# Patient Record
Sex: Male | Born: 2005
Health system: Southern US, Community
[De-identification: ages and names within clinical notes are randomized; demographics above are authoritative.]

## PROBLEM LIST (undated history)

## (undated) DIAGNOSIS — F88 Other disorders of psychological development: Secondary | ICD-10-CM

## (undated) DIAGNOSIS — F419 Anxiety disorder, unspecified: Secondary | ICD-10-CM

## (undated) DIAGNOSIS — R010 Benign and innocent cardiac murmurs: Secondary | ICD-10-CM

## (undated) DIAGNOSIS — F809 Developmental disorder of speech and language, unspecified: Secondary | ICD-10-CM

## (undated) DIAGNOSIS — F800 Phonological disorder: Secondary | ICD-10-CM

## (undated) DIAGNOSIS — R278 Other lack of coordination: Secondary | ICD-10-CM

## (undated) DIAGNOSIS — T7840XA Allergy, unspecified, initial encounter: Secondary | ICD-10-CM

## (undated) HISTORY — DX: Benign and innocent cardiac murmurs: R01.0

## (undated) HISTORY — DX: Developmental disorder of speech and language, unspecified: F80.9

## (undated) HISTORY — DX: Other lack of coordination: R27.8

## (undated) HISTORY — DX: Phonological disorder: F80.0

## (undated) HISTORY — PX: CIRCUMCISION: SUR203

## (undated) HISTORY — DX: Anxiety disorder, unspecified: F41.9

## (undated) HISTORY — DX: Allergy, unspecified, initial encounter: T78.40XA

## (undated) HISTORY — DX: Other disorders of psychological development: F88

---
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
## 2023-01-15 ENCOUNTER — Other Ambulatory Visit (HOSPITAL_COMMUNITY): Payer: Self-pay

## 2023-01-19 ENCOUNTER — Other Ambulatory Visit (HOSPITAL_COMMUNITY): Payer: Self-pay

## 2023-01-20 ENCOUNTER — Encounter: Payer: Self-pay | Admitting: Pharmacist

## 2023-01-20 ENCOUNTER — Other Ambulatory Visit: Payer: Self-pay

## 2023-01-20 ENCOUNTER — Other Ambulatory Visit (HOSPITAL_COMMUNITY): Payer: Self-pay

## 2023-01-26 ENCOUNTER — Other Ambulatory Visit: Payer: Self-pay

## 2023-01-28 ENCOUNTER — Other Ambulatory Visit (HOSPITAL_COMMUNITY): Payer: Self-pay

## 2023-02-02 ENCOUNTER — Encounter: Payer: Self-pay | Admitting: Pediatrics

## 2023-03-05 ENCOUNTER — Other Ambulatory Visit (HOSPITAL_COMMUNITY): Payer: Self-pay

## 2023-04-02 ENCOUNTER — Other Ambulatory Visit (HOSPITAL_COMMUNITY): Payer: Self-pay

## 2023-04-02 ENCOUNTER — Other Ambulatory Visit: Payer: Self-pay | Admitting: Pediatrics

## 2023-04-08 ENCOUNTER — Other Ambulatory Visit (HOSPITAL_COMMUNITY): Payer: Self-pay

## 2023-04-08 ENCOUNTER — Other Ambulatory Visit: Payer: Self-pay

## 2023-04-08 ENCOUNTER — Telehealth: Payer: Self-pay | Admitting: Pediatrics

## 2023-04-08 NOTE — Telephone Encounter (Signed)
Mother called and stated that Thomas Vazquez needs a refill on Azstarys sent to [facility].

## 2023-04-09 ENCOUNTER — Other Ambulatory Visit (HOSPITAL_COMMUNITY): Payer: Self-pay

## 2023-04-09 ENCOUNTER — Other Ambulatory Visit: Payer: Self-pay | Admitting: Pediatrics

## 2023-04-13 ENCOUNTER — Other Ambulatory Visit (HOSPITAL_COMMUNITY): Payer: Self-pay

## 2023-04-13 MED ORDER — AZSTARYS 26.1-5.2 MG PO CAPS
1.0000 | ORAL_CAPSULE | Freq: Every day | ORAL | 0 refills | Status: DC
  Filled 2023-04-13: qty 30, 30d supply, fill #0

## 2023-04-13 NOTE — Telephone Encounter (Signed)
Refilled Azstarys as Jerred is out of town in at school and could not come in for med check.  Has med check visit scheduled for next month.

## 2023-05-13 ENCOUNTER — Encounter: Payer: Self-pay | Admitting: Pediatrics

## 2023-05-13 ENCOUNTER — Ambulatory Visit (INDEPENDENT_AMBULATORY_CARE_PROVIDER_SITE_OTHER): Payer: Self-pay | Admitting: Pediatrics

## 2023-05-13 ENCOUNTER — Other Ambulatory Visit (HOSPITAL_COMMUNITY): Payer: Self-pay

## 2023-05-13 DIAGNOSIS — F900 Attention-deficit hyperactivity disorder, predominantly inattentive type: Secondary | ICD-10-CM

## 2023-05-13 MED ORDER — AZSTARYS 26.1-5.2 MG PO CAPS
1.0000 | ORAL_CAPSULE | Freq: Every day | ORAL | 0 refills | Status: DC
  Filled 2023-07-21: qty 30, 30d supply, fill #0

## 2023-05-13 MED ORDER — AZSTARYS 26.1-5.2 MG PO CAPS
1.0000 | ORAL_CAPSULE | Freq: Every day | ORAL | 0 refills | Status: AC
  Filled 2023-05-13: qty 30, 30d supply, fill #0

## 2023-05-13 MED ORDER — AZSTARYS 26.1-5.2 MG PO CAPS: 1.0000 | ORAL_CAPSULE | Freq: Every day | ORAL | 0 refills | Status: DC

## 2023-05-13 NOTE — Patient Instructions (Signed)

## 2023-05-13 NOTE — Progress Notes (Signed)
    Thomas Vazquez is a 17 y.o. old male here for ADHD medication management  BP 112/70   Ht 6\' 5"  (1.956 m)   Wt 162 lb 8 oz (73.7 kg)   BMI 19.27 kg/m  Blood pressure %iles are not available for patients who are 18 years or older.  --Normal growth parameters and Blood pressure.  --Parent reports child is doing well on present dose with no significant side effects reported.  --Plan to continue on current dose and will provide refill and 2 post dated prescriptions.  Plan to return in 3 months for ADHD med check or prior for any issues or concerns.   Meds ordered this encounter  Medications   Serdexmethylphen-Dexmethylphen (AZSTARYS) 26.1-5.2 MG CAPS    Sig: Take 1 capsule by mouth daily.    Dispense:  30 capsule    Refill:  0   Serdexmethylphen-Dexmethylphen (AZSTARYS) 26.1-5.2 MG CAPS    Sig: Take 1 capsule by mouth daily.    Dispense:  30 capsule    Refill:  0    Please do not fill till 06/12/2023   Serdexmethylphen-Dexmethylphen (AZSTARYS) 26.1-5.2 MG CAPS    Sig: Take 1 capsule by mouth daily.    Dispense:  30 capsule    Refill:  0    Please do not fill till 07/12/2023    Return in about 3 months (around 08/11/2023).  Thomas Vazquez D.O.

## 2023-05-17 ENCOUNTER — Other Ambulatory Visit (HOSPITAL_COMMUNITY): Payer: Self-pay

## 2023-05-23 NOTE — Progress Notes (Unsigned)
Follow Up Note  RE: Thomas Vazquez MRN: [id number] DOB: 09-07-2005 Date of Office Visit: 05/24/2023  Referring provider: Myles Gip, DO Primary care provider: Myles Gip, DO  Chief Complaint: Asthma  History of Present Illness: I had the pleasure of seeing Thomas Vazquez for a follow up visit at the [facility] on 05/24/2023. He is a 17 y.o. male, who is being followed for chronic cough, allergic rhinitis, asthma, reflux. His previous allergy office visit was on 11/23/2022 with Thermon Leyland, FNP. Today is a regular follow up visit.  He is accompanied today by his mother who provided/contributed to the history.   Discussed the use of AI scribe software for clinical note transcription with the patient, who gave verbal consent to proceed.  The patient presents with persistent coughing that has been ongoing for approximately four years. The cough is described as consistent throughout the day, sometimes accompanied by a need for throat clearing. The patient denies any production of phlegm with the cough. Despite multiple trials of various medications and consultations with a GI specialist the cough has not improved. The patient is currently on omeprazole, which he believes helps control his reflux.  The patient has also seen an ENT specialist and underwent a scope procedure, which did not reveal any abnormalities on the vocal cords. The patient has not yet pursued recommended speech therapy.   In addition to the chronic cough, the patient has been using Flovent inhaler every morning although the patient does not report any noticeable symptoms of asthma. The patient is active, engaging in high-intensity exercise without any reported breathing difficulties.  The patient also reports a sensation of postnasal drip, which leads to coughing. Despite consistent use of Flonase, there has been no noticeable improvement in this symptom. The patient also frequently  needs to blow his nose.  The patient is currently attending college in [place] and is due to return home in mid-May. The patient has not required any emergency or urgent care visits, and is not currently taking any allergy medications.     Assessment and Plan: Yash is a 17 y.o. male with: Chronic cough Past history - Chronic dry cough for 4 years. Tried Claritin, Zyrtec and Flonase with marginal benefit. 2021 bloodwork negative to environmental allergies. Takes PPI for reflux. Normal CXR. 2021 spirometry was normal but there was a 14% improvement in FEV1 post bronchodilator treatment and clinically feeling improved. 2021 skin testing positive to mold, cat, dust mites, borderline to weed. ENT noted LPRD. Interim history - no changes.  Refer to speech therapy for chronic cough - patient will plan on starting in May when he is home from college.  Moderate persistent asthma without complication Patient currently on Flovent (2 puffs daily), with some uncertainty about its effectiveness.  Today's spirometry showed mild obstructive disease with 17% improvement in FEV1 post bronchodilator treatment. Clinically feeling slightly improved.  Patient seems to be an underperceiver of his symptoms.  Daily controller medication(s): start Breo 1 puff once a day and rinse mouth after each use. If this is not covered let us know.  Demonstrated proper use.  Stop Flovent. May use albuterol rescue inhaler 2 puffs or nebulizer every 4 to 6 hours as needed for shortness of breath, chest tightness, coughing, and wheezing. May use albuterol rescue inhaler 2 puffs 5 to 15 minutes prior to strenuous physical activities. Monitor frequency of use - if you need to use it more than twice per week on  a consistent basis let us know.   Seasonal and perennial allergic rhinitis Past history - 2021 skin testing positive to mold, dust mites, cat and borderline to weed.  Interim history - some drainage. Continue allergen  avoidance measures. Use over the counter antihistamines such as Zyrtec (cetirizine), Claritin (loratadine), Allegra (fexofenadine), or Xyzal (levocetirizine) daily as needed. May take twice a day during allergy flares. May switch antihistamines every few months. Start dymista (fluticasone + azelastine nasal spray combination) 1 spray per nostril twice a day. This replaces all other nasal sprays. If it's not covered let us know.  Nasal saline spray (i.e., Simply Saline) or nasal saline lavage (i.e., NeilMed) is recommended as needed and prior to medicated nasal sprays.  Gastroesophageal reflux disease, unspecified whether esophagitis present Continue dietary and lifestyle modifications. Continue omeprazole 40mg  daily as previously prescribed. Nothing to eat/drink for 20-30 min afterwards.  Send me a mychart message in 2 months to give update on breathing/coughing.  Return in about 7 months (around 12/22/2023).  Meds ordered this encounter  Medications   Azelastine-Fluticasone 137-50 MCG/ACT SUSP    Sig: Place 1 spray into the nose in the morning and at bedtime.    Dispense:  23 g    Refill:  6   fluticasone furoate-vilanterol (BREO ELLIPTA) 100-25 MCG/ACT AEPB    Sig: Inhale 1 puff into the lungs daily. Rinse mouth after each use.    Dispense:  30 each    Refill:  6   Lab Orders  No laboratory test(s) ordered today    Diagnostics: Spirometry:  Tracings reviewed. His effort: Good reproducible efforts. FVC: 5.83L FEV1: 4.22L, 88% predicted FEV1/FVC ratio: 72% Interpretation: Spirometry consistent with mild obstructive disease with 17% improvement in FEV1 post bronchodilator treatment. Clinically feeling slightly improved.   Please see scanned spirometry results for details.  Medication List:  Current Outpatient Medications  Medication Sig Dispense Refill   Azelastine-Fluticasone 137-50 MCG/ACT SUSP Place 1 spray into the nose in the morning and at bedtime. 23 g 6   fluticasone  furoate-vilanterol (BREO ELLIPTA) 100-25 MCG/ACT AEPB Inhale 1 puff into the lungs daily. Rinse mouth after each use. 30 each 6   omeprazole (PRILOSEC) 40 MG capsule Take 1 capsule (40 mg total) by mouth daily. 30 capsule 5   Serdexmethylphen-Dexmethylphen (AZSTARYS) 26.1-5.2 MG CAPS Take 1 capsule by mouth daily. 30 capsule 0   sertraline (ZOLOFT) 50 MG tablet Take 1 tablet by mouth daily. 90 tablet 3   Spacer/Aero-Holding Chambers (OPTICHAMBER DIAMOND) MISC Use daily once at 6 AM. 1 each 0   albuterol (VENTOLIN HFA) 108 (90 Base) MCG/ACT inhaler Inhale 2 puffs into the lungs every 4 hours as needed for wheezing or shortness of breath (coughing fits, chest tightness). (Patient not taking: Reported on 05/24/2023) 6.7 g 2   [START ON 07/12/2023] Serdexmethylphen-Dexmethylphen (AZSTARYS) 26.1-5.2 MG CAPS Take 1 capsule by mouth daily. (Patient not taking: Reported on 05/24/2023) 30 capsule 0   No current facility-administered medications for this visit.   Allergies: No Known Allergies I reviewed his past medical history, social history, family history, and environmental history and no significant changes have been reported from his previous visit.  Review of Systems  Constitutional:  Negative for appetite change, chills, fever and unexpected weight change.  HENT:  Negative for congestion and rhinorrhea.   Eyes:  Negative for itching.  Respiratory:  Positive for cough. Negative for chest tightness, shortness of breath and wheezing.   Gastrointestinal:  Negative for abdominal pain.  Skin:  Negative for rash.  Allergic/Immunologic: Positive for environmental allergies.  Neurological:  Negative for headaches.    Objective: BP 118/66 (BP Location: Right Arm, Patient Position: Sitting, Cuff Size: Normal)   Pulse (!) 52   Temp 97.9 F (36.6 C) (Temporal)   Resp 16   Ht 5' 9.69" (1.77 m)   Wt 163 lb 1.6 oz (74 kg)   SpO2 98%   BMI 23.61 kg/m  Body mass index is 23.61 kg/m. Physical  Exam Vitals and nursing note reviewed.  Constitutional:      Appearance: Normal appearance. He is well-developed.  HENT:     Head: Normocephalic and atraumatic.     Right Ear: Tympanic membrane and external ear normal.     Left Ear: Tympanic membrane and external ear normal.     Nose: Nose normal.     Mouth/Throat:     Mouth: Mucous membranes are moist.     Pharynx: Oropharynx is clear.  Eyes:     Conjunctiva/sclera: Conjunctivae normal.  Cardiovascular:     Rate and Rhythm: Normal rate and regular rhythm.     Heart sounds: Normal heart sounds. No murmur heard. Pulmonary:     Effort: Pulmonary effort is normal.     Breath sounds: Normal breath sounds. No wheezing, rhonchi or rales.  Musculoskeletal:     Cervical back: Neck supple.  Skin:    General: Skin is warm.     Findings: No rash.  Neurological:     Mental Status: He is alert and oriented to person, place, and time.  Psychiatric:        Behavior: Behavior normal.    Previous notes and tests were reviewed. The plan was reviewed with the patient/family, and all questions/concerned were addressed.  It was my pleasure to see Kaveer today and participate in his care. Please feel free to contact me with any questions or concerns.  Sincerely,  Wyline Mood, DO Allergy & Immunology  [facility] office: [phone] [facility] office: [phone]

## 2023-05-24 ENCOUNTER — Other Ambulatory Visit: Payer: Self-pay

## 2023-05-24 ENCOUNTER — Telehealth: Payer: Self-pay

## 2023-05-24 ENCOUNTER — Encounter: Payer: Self-pay | Admitting: Allergy

## 2023-05-24 ENCOUNTER — Other Ambulatory Visit (HOSPITAL_COMMUNITY): Payer: Self-pay

## 2023-05-24 ENCOUNTER — Ambulatory Visit: Payer: Commercial Managed Care - PPO | Admitting: Allergy

## 2023-05-24 VITALS — BP 118/66 | HR 52 | Temp 97.9°F | Resp 16 | Ht 69.69 in | Wt 163.1 lb

## 2023-05-24 DIAGNOSIS — J3089 Other allergic rhinitis: Secondary | ICD-10-CM

## 2023-05-24 DIAGNOSIS — R053 Chronic cough: Secondary | ICD-10-CM | POA: Diagnosis not present

## 2023-05-24 DIAGNOSIS — K219 Gastro-esophageal reflux disease without esophagitis: Secondary | ICD-10-CM

## 2023-05-24 DIAGNOSIS — J4540 Moderate persistent asthma, uncomplicated: Secondary | ICD-10-CM

## 2023-05-24 DIAGNOSIS — J302 Other seasonal allergic rhinitis: Secondary | ICD-10-CM

## 2023-05-24 MED ORDER — AZELASTINE-FLUTICASONE 137-50 MCG/ACT NA SUSP
1.0000 | Freq: Two times a day (BID) | NASAL | 6 refills | Status: AC
  Filled 2023-05-24: qty 23, 30d supply, fill #0

## 2023-05-24 MED ORDER — FLUTICASONE FUROATE-VILANTEROL 100-25 MCG/ACT IN AEPB
1.0000 | INHALATION_SPRAY | Freq: Every day | RESPIRATORY_TRACT | 6 refills | Status: AC
  Filled 2023-05-24: qty 60, 60d supply, fill #0

## 2023-05-24 NOTE — Telephone Encounter (Addendum)
Forwarding message to [place] Administration:  Cough Refer to speech therapy for chronic cough.  Arlana Lindau, CCC-SLP

## 2023-05-24 NOTE — Patient Instructions (Signed)
Allergic rhinitis Continue allergen avoidance measures directed toward molds, dust mites, weed pollen, and cat hair. Use over the counter antihistamines such as Zyrtec (cetirizine), Claritin (loratadine), Allegra (fexofenadine), or Xyzal (levocetirizine) daily as needed. May take twice a day during allergy flares. May switch antihistamines every few months. Start dymista (fluticasone + azelastine nasal spray combination) 1 spray per nostril twice a day. This replaces all other nasal sprays. If it's not covered let us know.  Nasal saline spray (i.e., Simply Saline) or nasal saline lavage (i.e., NeilMed) is recommended as needed and prior to medicated nasal sprays.  Asthma Daily controller medication(s): start Breo 1 puff once a day and rinse mouth after each use. If this is not covered let us know.  Stop Flovent. May use albuterol rescue inhaler 2 puffs or nebulizer every 4 to 6 hours as needed for shortness of breath, chest tightness, coughing, and wheezing. May use albuterol rescue inhaler 2 puffs 5 to 15 minutes prior to strenuous physical activities. Monitor frequency of use - if you need to use it more than twice per week on a consistent basis let us know.  Breathing control goals:  Full participation in all desired activities (may need albuterol before activity) Albuterol use two times or less a week on average (not counting use with activity) Cough interfering with sleep two times or less a month Oral steroids no more than once a year No hospitalizations   Reflux Continue dietary and lifestyle modifications. Continue omeprazole 40mg  daily as previously prescribed. Nothing to eat/drink for 20-30 min afterwards.  Cough Refer to speech therapy for chronic cough.  Arlana Lindau, CCC-SLP   Send me a [employer] in 2 months about your breathing  Follow up in 7 months or sooner if needed.

## 2023-05-29 ENCOUNTER — Other Ambulatory Visit (HOSPITAL_COMMUNITY): Payer: Self-pay

## 2023-05-31 ENCOUNTER — Other Ambulatory Visit (HOSPITAL_COMMUNITY): Payer: Self-pay

## 2023-06-01 ENCOUNTER — Telehealth: Payer: Self-pay

## 2023-06-01 ENCOUNTER — Other Ambulatory Visit (HOSPITAL_COMMUNITY): Payer: Self-pay

## 2023-06-01 NOTE — Telephone Encounter (Signed)
 Pharmacy Patient Advocate Encounter   Received notification from CoverMyMeds that prior authorization for Azelastine -Fluticasone  137-50MCG/ACT suspension is required/requested.   Insurance verification completed.   The patient is insured through [employer] .   Per test claim: The current 30 day co-pay is, $15.00.  No PA needed at this time. This test claim was processed through [facility]- copay amounts may vary at other pharmacies due to pharmacy/plan contracts, or as the patient moves through the different stages of their insurance plan.     **Patient has 2 insurances on file. Must use insurance with [place], [place] [id number]

## 2023-06-04 ENCOUNTER — Other Ambulatory Visit (HOSPITAL_COMMUNITY): Payer: Self-pay

## 2023-06-09 NOTE — Telephone Encounter (Signed)
 Patient has been referred back to their office again. Their office contacted the patients mother 3 times last year to get scheduled, but mom never called back. I called and left a detailed voicemail on mom's phone to contact their office.   Onnie Bilis, MS [facility] [address] [place], [place] [address] [phone]

## 2023-06-15 ENCOUNTER — Other Ambulatory Visit (HOSPITAL_COMMUNITY): Payer: Self-pay

## 2023-06-15 ENCOUNTER — Other Ambulatory Visit: Payer: Self-pay

## 2023-06-15 ENCOUNTER — Other Ambulatory Visit: Payer: Self-pay | Admitting: Pediatrics

## 2023-06-16 ENCOUNTER — Other Ambulatory Visit (HOSPITAL_COMMUNITY): Payer: Self-pay

## 2023-06-16 ENCOUNTER — Telehealth: Payer: Self-pay | Admitting: Pediatrics

## 2023-06-16 MED ORDER — AZSTARYS 26.1-5.2 MG PO CAPS
1.0000 | ORAL_CAPSULE | Freq: Every day | ORAL | 0 refills | Status: AC
  Filled 2023-06-16: qty 30, 30d supply, fill #0

## 2023-06-16 NOTE — Telephone Encounter (Signed)
Needs Azstaryz rx sent in for January.  [facility]

## 2023-06-16 NOTE — Telephone Encounter (Signed)
Previous January script sent in with wrong end date.  Resent to [facility]  Meds ordered this encounter  Medications   Serdexmethylphen-Dexmethylphen (AZSTARYS) 26.1-5.2 MG CAPS    Sig: Take 1 capsule by mouth daily.    Dispense:  30 capsule    Refill:  0

## 2023-07-21 ENCOUNTER — Other Ambulatory Visit (HOSPITAL_COMMUNITY): Payer: Self-pay

## 2023-08-03 ENCOUNTER — Other Ambulatory Visit (HOSPITAL_COMMUNITY): Payer: Self-pay

## 2023-08-06 ENCOUNTER — Encounter: Payer: Self-pay | Admitting: Pediatrics

## 2023-08-06 ENCOUNTER — Other Ambulatory Visit (HOSPITAL_COMMUNITY): Payer: Self-pay

## 2023-08-06 ENCOUNTER — Ambulatory Visit (INDEPENDENT_AMBULATORY_CARE_PROVIDER_SITE_OTHER): Payer: Self-pay | Admitting: Pediatrics

## 2023-08-06 VITALS — BP 110/66 | Ht 71.0 in | Wt 164.8 lb

## 2023-08-06 DIAGNOSIS — F900 Attention-deficit hyperactivity disorder, predominantly inattentive type: Secondary | ICD-10-CM

## 2023-08-06 MED ORDER — AZSTARYS 26.1-5.2 MG PO CAPS
1.0000 | ORAL_CAPSULE | Freq: Every day | ORAL | 0 refills | Status: AC
Start: 2023-08-06 — End: 2023-09-23
  Filled 2023-08-06 – 2023-08-18 (×2): qty 30, 30d supply, fill #0

## 2023-08-06 MED ORDER — AZSTARYS 26.1-5.2 MG PO CAPS: 1.0000 | ORAL_CAPSULE | Freq: Every day | ORAL | 0 refills | Status: DC

## 2023-08-06 MED ORDER — AZSTARYS 26.1-5.2 MG PO CAPS
1.0000 | ORAL_CAPSULE | Freq: Every day | ORAL | 0 refills | Status: AC
Start: 2023-09-05 — End: 2023-10-05

## 2023-08-06 NOTE — Patient Instructions (Signed)
 Living With Attention Deficit Hyperactivity Disorder If you have been diagnosed with attention deficit hyperactivity disorder (ADHD), you may be relieved that you now know why you have felt or behaved a certain way. Still, you may feel overwhelmed about the treatment ahead. You may also wonder how to get the support you need and how to deal with the condition day-to-day. With treatment and support, you can live with ADHD and manage your symptoms. How to manage lifestyle changes Managing lifestyle changes can be challenging. Seeking support from your healthcare provider, therapist, family, and friends can be helpful. How to recognize changes in your condition The following signs may mean that your treatment is working well and your condition is improving: Consistently being on time for appointments. Being more organized at home and work. Other people noticing improvements in your behavior. Achieving goals that you set for yourself. Thinking more clearly. The following signs may mean that your treatment is not working very well: Feeling impatience or more confusion. Missing, forgetting, or being late for appointments. An increasing sense of disorganization and messiness. More difficulty in reaching goals that you set for yourself. Loved ones becoming angry or frustrated with you. Follow these instructions at home: Medicines Take over-the-counter and prescription medicines only as told by your health care provider. Check with your health care provider before taking any new medicines. General instructions Create structure and an organized atmosphere at home. For example: Make a list of tasks, then rank them from most important to least important. Work on one task at a time until your listed tasks are done. Make a daily schedule and follow it consistently every day. Use an appointment calendar, and check it 2-3 times a day to keep on track. Keep it with you when you leave the house. Create  spaces where you keep certain things, and always put things back in their places after you use them. Keep all follow-up visits. Your health care provider will need to monitor your condition and adjust your treatment over time. Where to find support Talking to others  Keep emotion out of important discussions and speak in a calm, logical way. Listen closely and patiently to your loved ones. Try to understand their point of view, and try to avoid getting defensive. Take responsibility for the consequences of your actions. Ask that others do not take your behaviors personally. Aim to solve problems as they come up, and express your feelings instead of bottling them up. Talk openly about what you need from your loved ones and how they can support you. Consider going to family therapy sessions or having your family meet with a specialist who deals with ADHD-related behavior problems. Finances Not all insurance plans cover mental health care, so it is important to check with your insurance carrier. If paying for co-pays or counseling services is a problem, search for a local or county mental health care center. Public mental health care services may be offered there at a low cost or no cost when you are not able to see a private health care provider. If you are taking medicine for ADHD, you may be able to get the generic form, which may be less expensive than brand-name medicine. Some makers of prescription medicines also offer help to patients who cannot afford the medicines that they need. Therapy and support groups Talking with a mental health care provider and participating in support groups can help to improve your quality of life, daily functioning, and overall symptoms. Questions to ask your health  care provider: What are the risks and benefits of taking medicines? Would I benefit from therapy? How often should I follow up with a health care provider? Where to find more information Learn more  about ADHD from: Children and Adults with Attention Deficit Hyperactivity Disorder: [employer]add[employer] of Mental Health: BloggerCourse.com Centers for Disease Control and Prevention: TonerPromos.no Contact a health care provider if: You have side effects from your medicines, such as: Repeated muscle twitches, coughing, or speech outbursts. Sleep problems. Loss of appetite. Dizziness. Unusually fast heartbeat. Stomach pains. Headaches. You have new or worsening behavior problems. You are struggling with anxiety, depression, or substance abuse. Get help right away if: You have a severe reaction to a medicine. These symptoms may be an emergency. Get help right away. Call 911. Do not wait to see if the symptoms will go away. Do not drive yourself to the hospital. Take one of these steps if you feel like you may hurt yourself or others, or have thoughts about taking your own life: Go to your nearest emergency room. Call 911. Call the National Suicide Prevention Lifeline at (989) 115-0802 or 988. This is open 24 hours a day. Text the Crisis Text Line at 802-008-3988. Summary With treatment and support, you can live with ADHD and manage your symptoms. Consider taking part in family therapy or self-help groups with family members or friends. When you talk with friends and family about your ADHD, be patient and communicate openly. Keep all follow-up visits. Your health care provider will need to monitor your condition and adjust your treatment over time. This information is not intended to replace advice given to you by your health care provider. Make sure you discuss any questions you have with your health care provider. Document Revised: 08/29/2021 Document Reviewed: 08/29/2021 Elsevier Patient Education  2024 [employer].

## 2023-08-06 NOTE — Progress Notes (Signed)
    Thomas Vazquez is a 18 y.o. old male here for ADHD medication management  BP 110/66   Ht 5\' 11"  (1.803 m)   Wt 164 lb 12.8 oz (74.8 kg)   BMI 22.98 kg/m  Blood pressure %iles are not available for patients who are 18 years or older.  --Normal growth parameters and Blood pressure.  --Parent reports child is doing well on present dose with no significant side effects reported.  --Plan to continue on current dose and will provide refill and 2 post dated prescriptions.  Plan to return in 3 months for ADHD med check or prior for any issues or concerns.   Meds ordered this encounter  Medications   Serdexmethylphen-Dexmethylphen (AZSTARYS) 26.1-5.2 MG CAPS    Sig: Take 1 capsule by mouth daily.    Dispense:  30 capsule    Refill:  0   Serdexmethylphen-Dexmethylphen (AZSTARYS) 26.1-5.2 MG CAPS    Sig: Take 1 capsule by mouth daily.    Dispense:  30 capsule    Refill:  0    Please do not fill till 09/05/2023   Serdexmethylphen-Dexmethylphen (AZSTARYS) 26.1-5.2 MG CAPS    Sig: Take 1 capsule by mouth daily.    Dispense:  30 capsule    Refill:  0    Please do not fill till 10/05/2023    Return in about 3 months (around 11/06/2023).  Thomas Vazquez D.O.

## 2023-08-18 ENCOUNTER — Other Ambulatory Visit (HOSPITAL_COMMUNITY): Payer: Self-pay

## 2023-08-19 ENCOUNTER — Other Ambulatory Visit (HOSPITAL_COMMUNITY): Payer: Self-pay

## 2023-08-19 ENCOUNTER — Other Ambulatory Visit: Payer: Self-pay | Admitting: Pediatrics

## 2023-08-19 MED ORDER — OMEPRAZOLE 40 MG PO CPDR
40.0000 mg | DELAYED_RELEASE_CAPSULE | Freq: Every day | ORAL | 5 refills | Status: AC
  Filled 2023-08-19: qty 30, 30d supply, fill #0

## 2023-09-10 ENCOUNTER — Other Ambulatory Visit (HOSPITAL_COMMUNITY): Payer: Self-pay

## 2023-11-04 ENCOUNTER — Encounter: Payer: Self-pay | Admitting: Pediatrics

## 2023-11-04 ENCOUNTER — Ambulatory Visit (INDEPENDENT_AMBULATORY_CARE_PROVIDER_SITE_OTHER): Payer: Self-pay | Admitting: Pediatrics

## 2023-11-04 ENCOUNTER — Other Ambulatory Visit (HOSPITAL_COMMUNITY): Payer: Self-pay

## 2023-11-04 VITALS — BP 118/72 | Ht 71.2 in | Wt 167.0 lb

## 2023-11-04 DIAGNOSIS — F900 Attention-deficit hyperactivity disorder, predominantly inattentive type: Secondary | ICD-10-CM

## 2023-11-04 MED ORDER — AZSTARYS 26.1-5.2 MG PO CAPS
1.0000 | ORAL_CAPSULE | Freq: Every day | ORAL | 0 refills | Status: AC
  Filled 2023-11-04: qty 30, 30d supply, fill #0

## 2023-11-04 MED ORDER — AZSTARYS 26.1-5.2 MG PO CAPS
1.0000 | ORAL_CAPSULE | Freq: Every day | ORAL | 0 refills | Status: AC
  Filled 2024-03-14: qty 30, 30d supply, fill #0

## 2023-11-04 MED ORDER — AZSTARYS 26.1-5.2 MG PO CAPS: 1.0000 | ORAL_CAPSULE | Freq: Every day | ORAL | 0 refills | Status: DC

## 2023-11-04 NOTE — Progress Notes (Signed)
    Thomas Vazquez is a 18 y.o. old male here for ADHD medication management  BP 118/72   Ht 5' 11.2 (1.808 m)   Wt 167 lb (75.8 kg)   BMI 23.16 kg/m  Blood pressure %iles are not available for patients who are 18 years or older.  --Normal growth parameters and Blood pressure.  --Parent reports child is doing well on present dose with no significant side effects reported.  --Plan to continue on current dose and will provide refill and 2 post dated prescriptions.  Plan to return in 3 months for ADHD med check or prior for any issues or concerns.   Meds ordered this encounter  Medications   Serdexmethylphen-Dexmethylphen (AZSTARYS ) 26.1-5.2 MG CAPS    Sig: Take 1 capsule by mouth daily.    Dispense:  30 capsule    Refill:  0   Serdexmethylphen-Dexmethylphen (AZSTARYS ) 26.1-5.2 MG CAPS    Sig: Take 1 capsule by mouth daily.    Dispense:  30 capsule    Refill:  0    Please do not fill till 12/04/2023   Serdexmethylphen-Dexmethylphen (AZSTARYS ) 26.1-5.2 MG CAPS    Sig: Take 1 capsule by mouth daily.    Dispense:  30 capsule    Refill:  0    Please do not fill till 01/03/2024    Return in about 3 months (around 02/04/2024).  Avie Boeck [name] D.O.

## 2023-11-04 NOTE — Patient Instructions (Signed)
Living With Attention Deficit Hyperactivity Disorder, Teen  If you have been diagnosed with attention deficit hyperactivity disorder (ADHD), you may be relieved that you now know why you have felt or behaved a certain way. Still, you may feel overwhelmed about the treatment ahead. You may also wonder how to get the support you need and how to deal with the condition on a day-to-day basis. With treatment and support, you can live with ADHD and do well in school and relationships. How to manage lifestyle changes Managing lifestyle changes can be challenging. Seeking support from your healthcare provider, therapist, family, and friends can be helpful. Managing stress Stress is your body's reaction to life changes and events, both good and bad. Some steps you can take to cope with stress include: Learning more about ADHD. Exercising regularly. Even a short daily walk can lower stress levels. Getting enough sleep each night and eating a healthy diet. Taking time each day to practice stress-reduction techniques, such as deep breathing, meditation, or yoga. Spending time laughing, having fun with friends, and keeping a positive attitude. Do not use alcohol, tobacco, or illegal drugs to manage stress. Relationships To strengthen your relationships with family members while treating your condition, consider taking part in family therapy. You might also: Attend self-help groups alone or with a loved one. Write down the rules that everyone agrees to follow. Talk to your parents about things that are difficult for you. Talk to your parents about gaining more independence as you are ready.  Coping with ADHD in school You can take these steps to help manage your schoolwork: Complete homework in a quiet room, free from distractions. Check in regularly with your teachers about class notes, assignments, and tests. Use your school planner to keep track of due dates for assignments and homework. You can also  ask a counselor or therapist about ways that would help you to: Take notes. Keep your schoolwork and schedule organized. Study more efficiently. Manage your time. Talk to your teachers about your ADHD and ways that they can help you. Some things that teachers can do to help include: Reading about "tips for teachers" on managing ADHD by going to chadd.org or other trusted sources. Giving you the help and support that you may need, perhaps through a 504 plan or other educational accommodations. Having regular meetings with you to check in on how you are doing. Scheduling extra time for you to complete assignments and tests. Tutoring you after school if necessary. Talking to others Explain how ADHD affects you at school and at home. Talk about the symptoms that it causes. Share some basic facts about ADHD, such as: ADHD involves the brain and affects your behavior. You cannot just tell yourself to focus or sit still. You learn ways to make challenging things, such as staying organized, easier for you. You may choose or prefer activities or careers that do not require you to sit still or pay attention for long periods of time. Consider the following when talking to others: Try to keep your emotion out of important discussions, and speak in a calm, logical way. Listen closely and patiently to your loved ones. Try to understand their point of view, and try to avoid getting defensive. Take responsibility for your actions. Ask that others do not take your behaviors personally. Talk openly about what you need from your loved ones and how they can support you. How to recognize changes in your condition The following signs may mean that your treatment is  working well and your condition is improving: You are on time for school and other commitments. You are more organized. Others notice improvement in your behaviors. You are thinking more clearly. The following signs may mean that your treatment is  not working well: Problems with organization. Difficulty staying focused. Problems completing assignments at school. Not listening to instructions. Loved ones are frustrated with you. Forgetting things often. Follow these instructions at home: Medicines Take over-the-counter and prescription medicines only as told by your health care provider. Your health care provider may suggest certain medicines and therapy may also be prescribed. Check with your health care provider before taking any new medicines. General instructions Create structure and an organized atmosphere at home. For example: Make a list of tasks, then rank them from most important to least important. Work on one task at a time until your listed tasks are done. Make a daily schedule and follow it consistently every day. Use an appointment calendar, and check it 2-3 times a day to keep on track. Keep it with you when you leave the house. Create spaces where you keep certain things, and always put things back in their places after you use them. Keep all follow-up visits. Your health care provider will need to monitor your condition and adjust your treatment over time. Where to find more information Learn more about ADHD from: Children and Adults with Attention Deficit Hyperactivity Disorder: chadd.[employer] of Mental Health: BloggerCourse.com Centers for Disease Control and Prevention: TonerPromos.no Contact a health care provider if: You have side effects from your medicine such as: Repeated muscle twitches, coughing, or speech outbursts. Trouble sleeping. Loss of appetite. Dizziness. Racing heartbeat. Stomach pain. Headaches. You have new or worsening behavior problems. You are struggling with anxiety, depression, or substance abuse. Get help right away if: You have a severe reaction to a medicine. These symptoms may be an emergency. Get help right away. Call 911. Do not wait to see if the symptoms will go  away. Do not drive yourself to the hospital. Take one of these steps if you feel like you may hurt yourself or others, or have thoughts about taking your own life: Go to your nearest emergency room. Call 911. Call the National Suicide Prevention Lifeline at 434 235 5173 or 988. This is open 24 hours a day. Text the Crisis Text Line at (236) 031-9588. Summary With treatment and support, you can live with ADHD and do well in school and relationships. To strengthen your relationships with family members while treating your condition, consider taking part in family therapy. Talk to your teachers about ways they can help you. Do your homework in a quiet room. Keep all follow-up visits. Your health care provider will need to monitor your condition and adjust your treatment over time. This information is not intended to replace advice given to you by your health care provider. Make sure you discuss any questions you have with your health care provider. Document Revised: 08/29/2021 Document Reviewed: 08/29/2021 Elsevier Patient Education  2024 [employer].

## 2023-12-21 NOTE — Progress Notes (Deleted)
 Follow Up Note  RE: Thomas Vazquez MRN: [id number] DOB: 03-01-2006 Date of Office Visit: 12/22/2023  Referring provider: Birdie Abran Hamilton, DO Primary care provider: Birdie Abran Hamilton, DO  Chief Complaint: No chief complaint on file.  History of Present Illness: I had the pleasure of seeing [name] for a follow up visit at the [facility] on 12/22/2023. He is a 18 y.o. male, who is being followed for chronic cough, asthma, allergic rhinitis, GERD. His previous allergy  office visit was on 05/24/2023 with Dr. Luke. Today is a regular follow up visit.  Discussed the use of AI scribe software for clinical note transcription with the patient, who gave verbal consent to proceed.  History of Present Illness            Did not go to speech  Assessment and Plan: Kullen is a 18 y.o. male with: Chronic cough Past history - Chronic dry cough for 4 years. Tried Claritin , Zyrtec and Flonase  with marginal benefit. 2021 bloodwork negative to environmental allergies. Takes PPI for reflux. Normal CXR. 2021 spirometry was normal but there was a 14% improvement in FEV1 post bronchodilator treatment and clinically feeling improved. 2021 skin testing positive to mold, cat, dust mites, borderline to weed. ENT noted LPRD. Interim history - no changes.  Refer to speech therapy for chronic cough - patient will plan on starting in May when he is home from college.   Moderate persistent asthma without complication Patient currently on Flovent  (2 puffs daily), with some uncertainty about its effectiveness.  Today's spirometry showed mild obstructive disease with 17% improvement in FEV1 post bronchodilator treatment. Clinically feeling slightly improved.  Patient seems to be an underperceiver of his symptoms.  Daily controller medication(s): start Breo 100mcg 1 puff once a day and rinse mouth after each use. If this is not covered let us  know.  Demonstrated proper  use.  Stop Flovent . May use albuterol  rescue inhaler 2 puffs or nebulizer every 4 to 6 hours as needed for shortness of breath, chest tightness, coughing, and wheezing. May use albuterol  rescue inhaler 2 puffs 5 to 15 minutes prior to strenuous physical activities. Monitor frequency of use - if you need to use it more than twice per week on a consistent basis let us  know.    Seasonal and perennial allergic rhinitis Past history - 2021 skin testing positive to mold, dust mites, cat and borderline to weed.  Interim history - some drainage. Continue allergen avoidance measures. Use over the counter antihistamines such as Zyrtec (cetirizine), Claritin  (loratadine ), Allegra (fexofenadine), or Xyzal (levocetirizine) daily as needed. May take twice a day during allergy  flares. May switch antihistamines every few months. Start dymista  (fluticasone  + azelastine  nasal spray combination) 1 spray per nostril twice a day. This replaces all other nasal sprays. If it's not covered let us  know.  Nasal saline spray (i.e., Simply Saline) or nasal saline lavage (i.e., NeilMed) is recommended as needed and prior to medicated nasal sprays.   Gastroesophageal reflux disease, unspecified whether esophagitis present Continue dietary and lifestyle modifications. Continue omeprazole  40mg  daily as previously prescribed. Nothing to eat/drink for 20-30 min afterwards.   Send me a mychart message in 2 months to give update on breathing/coughing.  Assessment and Plan              No follow-ups on file.  No orders of the defined types were placed in this encounter.  Lab Orders  No laboratory test(s) ordered today  Diagnostics: Spirometry:  Tracings reviewed. His effort: {Blank single:19197::Good reproducible efforts.,It was hard to get consistent efforts and there is a question as to whether this reflects a maximal maneuver.,Poor effort, data can not be interpreted.} FVC: ***L FEV1: ***L, ***%  predicted FEV1/FVC ratio: ***% Interpretation: {Blank single:19197::Spirometry consistent with mild obstructive disease,Spirometry consistent with moderate obstructive disease,Spirometry consistent with severe obstructive disease,Spirometry consistent with possible restrictive disease,Spirometry consistent with mixed obstructive and restrictive disease,Spirometry uninterpretable due to technique,Spirometry consistent with normal pattern,No overt abnormalities noted given today's efforts}.  Please see scanned spirometry results for details.  Skin Testing: {Blank single:19197::Select foods,Environmental allergy  panel,Environmental allergy  panel and select foods,Food allergy  panel,None,Deferred due to recent antihistamines use}. *** Results discussed with patient/family.   Medication List:  Current Outpatient Medications  Medication Sig Dispense Refill   albuterol  (VENTOLIN  HFA) 108 (90 Base) MCG/ACT inhaler Inhale 2 puffs into the lungs every 4 hours as needed for wheezing or shortness of breath (coughing fits, chest tightness). (Patient not taking: Reported on 05/24/2023) 6.7 g 2   Azelastine -Fluticasone  137-50 MCG/ACT SUSP Place 1 spray into the nose in the morning and at bedtime. 23 g 6   fluticasone  furoate-vilanterol (BREO ELLIPTA ) 100-25 MCG/ACT AEPB Inhale 1 puff into the lungs daily. Rinse mouth after each use. 30 each 6   omeprazole  (PRILOSEC) 40 MG capsule Take 1 capsule (40 mg total) by mouth daily. 30 capsule 5   Serdexmethylphen-Dexmethylphen (AZSTARYS ) 26.1-5.2 MG CAPS Take 1 capsule by mouth daily. 30 capsule 0   [START ON 01/03/2024] Serdexmethylphen-Dexmethylphen (AZSTARYS ) 26.1-5.2 MG CAPS Take 1 capsule by mouth daily. 30 capsule 0   sertraline  (ZOLOFT ) 50 MG tablet Take 1 tablet by mouth daily. 90 tablet 3   Spacer/Aero-Holding Chambers (OPTICHAMBER DIAMOND ) MISC Use daily once at 6 AM. 1 each 0   No current facility-administered medications for this  visit.   Allergies: No Known Allergies I reviewed his past medical history, social history, family history, and environmental history and no significant changes have been reported from his previous visit.  Review of Systems  Constitutional:  Negative for appetite change, chills, fever and unexpected weight change.  HENT:  Negative for congestion and rhinorrhea.   Eyes:  Negative for itching.  Respiratory:  Positive for cough. Negative for chest tightness, shortness of breath and wheezing.   Gastrointestinal:  Negative for abdominal pain.  Skin:  Negative for rash.  Allergic/Immunologic: Positive for environmental allergies.  Neurological:  Negative for headaches.    Objective: There were no vitals taken for this visit. There is no height or weight on file to calculate BMI. Physical Exam Vitals and nursing note reviewed.  Constitutional:      Appearance: Normal appearance. He is well-developed.  HENT:     Head: Normocephalic and atraumatic.     Right Ear: Tympanic membrane and external ear normal.     Left Ear: Tympanic membrane and external ear normal.     Nose: Nose normal.     Mouth/Throat:     Mouth: Mucous membranes are moist.     Pharynx: Oropharynx is clear.  Eyes:     Conjunctiva/sclera: Conjunctivae normal.  Cardiovascular:     Rate and Rhythm: Normal rate and regular rhythm.     Heart sounds: Normal heart sounds. No murmur heard. Pulmonary:     Effort: Pulmonary effort is normal.     Breath sounds: Normal breath sounds. No wheezing, rhonchi or rales.  Musculoskeletal:     Cervical back: Neck supple.  Skin:  General: Skin is warm.     Findings: No rash.  Neurological:     Mental Status: He is alert and oriented to person, place, and time.  Psychiatric:        Behavior: Behavior normal.    Previous notes and tests were reviewed. The plan was reviewed with the patient/family, and all questions/concerned were addressed.  It was my pleasure to see Grason  today and participate in his care. Please feel free to contact me with any questions or concerns.  Sincerely,  Orlan Cramp, DO Allergy  & Immunology  [facility]  [place] office: [phone] [facility] office: [phone]

## 2023-12-22 ENCOUNTER — Ambulatory Visit: Payer: Managed Care, Other (non HMO) | Admitting: Allergy

## 2023-12-29 ENCOUNTER — Encounter: Payer: Self-pay | Admitting: Pediatrics

## 2023-12-29 ENCOUNTER — Ambulatory Visit (INDEPENDENT_AMBULATORY_CARE_PROVIDER_SITE_OTHER): Admitting: Pediatrics

## 2023-12-29 ENCOUNTER — Other Ambulatory Visit (HOSPITAL_COMMUNITY): Payer: Self-pay

## 2023-12-29 VITALS — BP 118/68 | Ht 71.2 in | Wt 162.1 lb

## 2023-12-29 DIAGNOSIS — Z0000 Encounter for general adult medical examination without abnormal findings: Secondary | ICD-10-CM

## 2023-12-29 DIAGNOSIS — Z1339 Encounter for screening examination for other mental health and behavioral disorders: Secondary | ICD-10-CM | POA: Diagnosis not present

## 2023-12-29 DIAGNOSIS — Z68.41 Body mass index (BMI) pediatric, 5th percentile to less than 85th percentile for age: Secondary | ICD-10-CM

## 2023-12-29 DIAGNOSIS — Z00129 Encounter for routine child health examination without abnormal findings: Secondary | ICD-10-CM

## 2023-12-29 MED ORDER — AZSTARYS 26.1-5.2 MG PO CAPS
1.0000 | ORAL_CAPSULE | Freq: Every day | ORAL | 0 refills | Status: DC
  Filled 2024-05-14 – 2024-05-15 (×2): qty 30, 30d supply, fill #0

## 2023-12-29 MED ORDER — AZSTARYS 26.1-5.2 MG PO CAPS: 1.0000 | ORAL_CAPSULE | Freq: Every day | ORAL | 0 refills | Status: AC

## 2023-12-29 NOTE — Progress Notes (Signed)
 Adolescent Well Care Visit Thomas Vazquez is a 18 y.o. male who is here for well care.    PCP:  Birdie Abran Hamilton, DO   History was provided by the patient and mother.  Confidentiality was discussed with the patient and, if applicable, with caregiver as well.   Current Issues: Current concerns include:.   --history of anxiety and depression.  Not on zoloft  anymore and feels he is doing well since about may.  Stopped the Prilosec and seems to be doing fine.    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  [employer] School performance: doing well; no concerns School Behavior: doing well; no concerns  Menstruation:   No LMP for male patient. Menstrual History: NA   Confidential Social History: Tobacco?  no Secondhand smoke exposure?  no Drugs/ETOH?  no  Sexually Active?  no   Pregnancy Prevention: discussed  Safe at home, in school & in relationships?  Yes Safe to self?  Yes   Screenings: Patient has a dental home: yes, has dentist,   : healthy eating, exercise, and screen time  In addition, the following topics were discussed as part of anticipatory guidance healthy eating, exercise, mental health issues, and screen time.  PHQ-9 completed and results indicated no concerns   Physical Exam:  Vitals:   12/29/23 1012  BP: 118/68  Weight: 162 lb 2 oz (73.5 kg)  Height: 5' 11.2 (1.808 m)   BP 118/68   Ht 5' 11.2 (1.808 m)   Wt 162 lb 2 oz (73.5 kg)   BMI 22.48 kg/m  Body mass index: body mass index is  22.48 kg/m. Blood pressure %iles are not available for patients who are 18 years or older.  Hearing Screening   500Hz  1000Hz  2000Hz  3000Hz  4000Hz   Right ear 20 20 20 20 20   Left ear 20 20 20 20 20    Vision Screening   Right eye Left eye Both eyes  Without correction     With correction 10/10 10/10     General Appearance:   alert, oriented, no acute distress and well nourished  HENT: Normocephalic, no obvious abnormality, conjunctiva clear  Mouth:   Normal appearing teeth, no obvious discoloration, dental caries, or dental caps  Neck:   Supple; thyroid: no enlargement, symmetric, no tenderness/mass/nodules  Chest Normal male  Lungs:   Clear to auscultation bilaterally, normal work of breathing  Heart:   Regular rate and rhythm, S1 and S2 normal, no murmurs;   Abdomen:   Soft, non-tender, no mass, or organomegaly  GU genitalia not examined  Musculoskeletal:   Tone and strength strong and symmetrical, all extremities  no scoliosis             Lymphatic:   No cervical adenopathy  Skin/Hair/Nails:   Skin warm, dry and intact, no rashes, no bruises or petechiae  Neurologic:   Strength, gait, and coordination normal and age-appropriate     Assessment and Plan:   1. Encounter for routine child health examination without abnormal findings   2. BMI (body mass index), pediatric, 5% to less than 85% for age     --  Refilled Azstarys  medication below on current dose.  Not needing to take over the summer so he has not picked up recent medication.  He already has a script that is available on 8/11 so will leave that in place and send 2 post date scripts.   --declines bloodwork today (CBC, CMP, TSH, fT4, lipid panel, STD testing)  Meds ordered this encounter  Medications   Serdexmethylphen-Dexmethylphen (AZSTARYS ) 26.1-5.2 MG CAPS    Sig: Take 1 capsule by mouth daily.    Dispense:  30 capsule    Refill:  0    Please do not fill till 02/02/2024   Serdexmethylphen-Dexmethylphen (AZSTARYS )  26.1-5.2 MG CAPS    Sig: Take 1 capsule by mouth daily.    Dispense:  30 capsule    Refill:  0    Please do not fill till 03/03/2024     BMI is appropriate for age  Hearing screening result:normal Vision screening result: normal, with correction   No orders of the defined types were placed in this encounter.    Return in about 1 year (around 12/28/2024), or and every 3 months for ADHD management...  Abran Glendia Ro, DO

## 2023-12-29 NOTE — Patient Instructions (Signed)
 Well Child Nutrition, Young Adult The following information provides general nutrition recommendations. Talk with a health care provider or a diet and nutrition specialist (dietitian) if you have any questions. Nutrition The amount of food you need to eat every day depends on your age, sex, size, and activity level. To figure out your daily calorie needs, look for a calorie calculator online or talk with your health care provider. Balanced diet Eat a balanced diet. Try to include: Fruits. Aim for 1-2 cups a day. Examples of 1 cup of fruit include 1 large banana, 1 small apple, 8 large strawberries, 1 large orange,  cup (80 g) dried fruit, or 1 cup (250 mL) of 100% fruit juice. Eat a variety of whole fruits and 100% fruit juice. Choose fresh, canned, frozen, or dried forms. Choose canned fruit that has the lowest added sugar or no added sugar. Vegetables. Aim for 2-4 cups a day. Examples of 1 cup of vegetables include 2 medium carrots, 1 large tomato, 2 stalks of celery, or 2 cups (62 g) of raw leafy greens. Choose fresh, frozen, canned, and dried options. Eat vegetables of a variety of colors. Low-fat or fat-free dairy. Aim for 3 cups a day. Examples of 1 cup of dairy include 8 oz (230 mL) of milk, 8 oz (230 g) of yogurt, or 1 oz (44 g) of natural cheese. If you are unable to tolerate dairy (lactose intolerant) or you choose not to consume dairy, you may include fortified soy beverages (soy milk). Grains. Aim for 6-10 "ounce-equivalents" of grain foods (such as pasta, rice, and tortillas) a day. Examples of 1 ounce-equivalent of grains include 1 cup (60 g) of ready-to-eat cereal,  cup (79 g) of cooked rice, or 1 slice of bread. Of the grain foods that you eat each day, aim to include 3-5 ounce-equivalents of whole-grain options. Examples of whole grains include whole wheat, brown rice, wild rice, quinoa, and oats. Lean proteins. Aim for 5-7 ounce-equivalents a day. Eat a variety of protein foods,  including lean meats, seafood, poultry, eggs, legumes (beans and peas), nuts, seeds, and soy products. A cut of meat or fish that is the size of a deck of cards is about 3-4 ounce-equivalents (85 g). Foods that provide 1 ounce-equivalent of protein include 1 egg,  ounce (28 g) of nuts or seeds, or 1 tablespoon (16 g) of peanut butter. For more information and options for foods in a balanced diet, visit www.[employer].be Tips for healthy snacking A snack should not be the size of a full meal. Eat snacks that have 200 calories or less. Examples include:  whole-wheat pita with  cup (40 g) hummus. 2 or 3 slices of deli [place] wrapped around a cheese stick.  apple with 1 tablespoon (16 g) of peanut butter. 10 baked chips with salsa. Keep cut-up fruits and vegetables available at home and at school so they are easy to eat. Pack healthy snacks the night before or when you pack your lunch. Avoid pre-packaged foods. These tend to be higher in fat, sugar, and salt (sodium). Get involved with shopping, or ask the primary food shopper in your household to get healthy snacks that you like. Avoid chips, candy, cake, and soft drinks. Foods to avoid [place] or heavily processed foods, such as toaster pastries and microwaveable dinners. Drinks that contain a lot of sugar, such as sports drinks, sodas, and juice. Foods that contain a lot of fat, sodium, or sugar. Food safety Prepare your food safely: Wash your hands  after handling raw meats. Keep food preparation surfaces clean by washing them regularly with hot, soapy water. Keep raw meats separate from foods that are ready-to-eat, such as fruits and vegetables. Cook seafood, meat, poultry, and eggs to the recommended minimum safe internal temperature. Store foods at safe temperatures. In general: Keep cold foods at 71F (4C) or colder. Keep your freezer at 79F (-18C or 18 degrees below 0C) or colder. Keep hot foods at 171F (60C) or  warmer. Foods are no longer safe to eat when they have been at a temperature of 40-171F (4-60C) for more than 2 hours. Physical activity Try to get 150 minutes of moderate-intensity physical activity each week. Examples include walking briskly or bicycling slower than 10 miles an hour (16 km an hour). Do muscle-strengthening exercises on 2 or more days a week. If you find it difficult to fit regular physical activity into your schedule, try: Taking the stairs instead of the elevator. Parking your car farther from the entrance or at the back of the parking lot. Biking or walking to work or school. If you need to lose weight, you may need to reduce your daily calorie intake and increase your daily amount of physical activity. Check with your health care provider before you start a new diet and exercise plan. General instructions Do not skip meals, especially breakfast. Water is the ideal beverage. Aim to drink six 8-oz (240 mL) glasses of water each day. Avoid fad diets. These may affect your mood and growth. If you choose to drink alcohol: Drink in moderation. This means two drinks a day for men and one drink a day for women who are not pregnant. One drink equals 12 oz (355 mL) of beer, 5 oz (148 mL) of wine, or 1 oz (44 mL) of hard liquor. You may drink coffee. It is recommended that you limit coffee intake to three to five 8-oz (240 mL) cups a day (up to 400 mg of caffeine). If you are worried about your body image, talk with your parents, your health care provider, or another trusted adult like a coach or counselor. You may be at risk for developing an eating disorder. Eating disorders can lead to serious medical problems. Food allergies may cause you to have a reaction (such as a rash, diarrhea, or vomiting) after eating or drinking. Talk with your health care provider if you have concerns about food allergies. Summary Eat a balanced diet. Include fruits, vegetables, low-fat dairy, whole  grains, and lean proteins. Try to get 150 minutes of moderate-intensity physical activity each week, and do muscle-strengthening exercises on 2 or more days a week. Choose healthy snacks that are 200 calories or less. Drink plenty of water. Try to drink six 8-oz (240 mL) glasses a day. This information is not intended to replace advice given to you by your health care provider. Make sure you discuss any questions you have with your health care provider. Document Revised: 04/29/2021 Document Reviewed: 04/29/2021 Elsevier Patient Education  2024 [employer].

## 2024-01-07 ENCOUNTER — Encounter: Admitting: Pediatrics

## 2024-03-06 ENCOUNTER — Ambulatory Visit (INDEPENDENT_AMBULATORY_CARE_PROVIDER_SITE_OTHER): Payer: Self-pay | Admitting: Pediatrics

## 2024-03-06 VITALS — BP 112/72 | Ht 71.1 in | Wt 168.5 lb

## 2024-03-06 DIAGNOSIS — F900 Attention-deficit hyperactivity disorder, predominantly inattentive type: Secondary | ICD-10-CM

## 2024-03-06 MED ORDER — SERDEXMETHYLPHEN-DEXMETHYLPHEN 26.1-5.2 MG PO CAPS
1.0000 | ORAL_CAPSULE | Freq: Every day | ORAL | 0 refills | Status: AC
  Filled 2024-05-14: qty 30, 30d supply, fill #0

## 2024-03-06 MED ORDER — SERDEXMETHYLPHEN-DEXMETHYLPHEN 26.1-5.2 MG PO CAPS: 1.0000 | ORAL_CAPSULE | Freq: Every day | ORAL | 0 refills | Status: AC

## 2024-03-06 NOTE — Progress Notes (Signed)
    Kaysan is a 18 y.o. old male here for ADHD medication management  BP 112/72   Ht 5' 11.1 (1.806 m)   Wt 168 lb 8 oz (76.4 kg)   BMI 23.43 kg/m  Blood pressure %iles are not available for patients who are 18 years or older.  --Normal growth parameters and Blood pressure.  --Parent reports child is doing well on present dose with no significant side effects reported.  --Plan to continue on current dose and will provide refill and 2 post dated prescriptions.  Plan to return in 3 months for ADHD med check or prior for any issues or concerns.   Meds ordered this encounter  Medications   Serdexmethylphen-Dexmethylphen 26.1-5.2 MG CAPS    Sig: Take 1 capsule by mouth daily.    Dispense:  30 capsule    Refill:  0    Please do not fill till 04/02/24   Serdexmethylphen-Dexmethylphen 26.1-5.2 MG CAPS    Sig: Take 1 capsule by mouth daily.    Dispense:  30 capsule    Refill:  0    Please do not fill till 05/02/24   Serdexmethylphen-Dexmethylphen 26.1-5.2 MG CAPS    Sig: Take 1 capsule by mouth daily.    Dispense:  30 capsule    Refill:  0    Please do not fill till 06/02/23    Return in about 3 months (around 06/06/2024).  Abran Hamilton Giovoni Bunch D.O.

## 2024-03-06 NOTE — Patient Instructions (Signed)
Living With Attention Deficit Hyperactivity Disorder, Teen  If you have been diagnosed with attention deficit hyperactivity disorder (ADHD), you may be relieved that you now know why you have felt or behaved a certain way. Still, you may feel overwhelmed about the treatment ahead. You may also wonder how to get the support you need and how to deal with the condition on a day-to-day basis. With treatment and support, you can live with ADHD and do well in school and relationships. How to manage lifestyle changes Managing lifestyle changes can be challenging. Seeking support from your healthcare provider, therapist, family, and friends can be helpful. Managing stress Stress is your body's reaction to life changes and events, both good and bad. Some steps you can take to cope with stress include: Learning more about ADHD. Exercising regularly. Even a short daily walk can lower stress levels. Getting enough sleep each night and eating a healthy diet. Taking time each day to practice stress-reduction techniques, such as deep breathing, meditation, or yoga. Spending time laughing, having fun with friends, and keeping a positive attitude. Do not use alcohol, tobacco, or illegal drugs to manage stress. Relationships To strengthen your relationships with family members while treating your condition, consider taking part in family therapy. You might also: Attend self-help groups alone or with a loved one. Write down the rules that everyone agrees to follow. Talk to your parents about things that are difficult for you. Talk to your parents about gaining more independence as you are ready.  Coping with ADHD in school You can take these steps to help manage your schoolwork: Complete homework in a quiet room, free from distractions. Check in regularly with your teachers about class notes, assignments, and tests. Use your school planner to keep track of due dates for assignments and homework. You can also  ask a counselor or therapist about ways that would help you to: Take notes. Keep your schoolwork and schedule organized. Study more efficiently. Manage your time. Talk to your teachers about your ADHD and ways that they can help you. Some things that teachers can do to help include: Reading about "tips for teachers" on managing ADHD by going to chadd.org or other trusted sources. Giving you the help and support that you may need, perhaps through a 504 plan or other educational accommodations. Having regular meetings with you to check in on how you are doing. Scheduling extra time for you to complete assignments and tests. Tutoring you after school if necessary. Talking to others Explain how ADHD affects you at school and at home. Talk about the symptoms that it causes. Share some basic facts about ADHD, such as: ADHD involves the brain and affects your behavior. You cannot just tell yourself to focus or sit still. You learn ways to make challenging things, such as staying organized, easier for you. You may choose or prefer activities or careers that do not require you to sit still or pay attention for long periods of time. Consider the following when talking to others: Try to keep your emotion out of important discussions, and speak in a calm, logical way. Listen closely and patiently to your loved ones. Try to understand their point of view, and try to avoid getting defensive. Take responsibility for your actions. Ask that others do not take your behaviors personally. Talk openly about what you need from your loved ones and how they can support you. How to recognize changes in your condition The following signs may mean that your treatment is  working well and your condition is improving: You are on time for school and other commitments. You are more organized. Others notice improvement in your behaviors. You are thinking more clearly. The following signs may mean that your treatment is  not working well: Problems with organization. Difficulty staying focused. Problems completing assignments at school. Not listening to instructions. Loved ones are frustrated with you. Forgetting things often. Follow these instructions at home: Medicines Take over-the-counter and prescription medicines only as told by your health care provider. Your health care provider may suggest certain medicines and therapy may also be prescribed. Check with your health care provider before taking any new medicines. General instructions Create structure and an organized atmosphere at home. For example: Make a list of tasks, then rank them from most important to least important. Work on one task at a time until your listed tasks are done. Make a daily schedule and follow it consistently every day. Use an appointment calendar, and check it 2-3 times a day to keep on track. Keep it with you when you leave the house. Create spaces where you keep certain things, and always put things back in their places after you use them. Keep all follow-up visits. Your health care provider will need to monitor your condition and adjust your treatment over time. Where to find more information Learn more about ADHD from: Children and Adults with Attention Deficit Hyperactivity Disorder: chadd.[employer] of Mental Health: BloggerCourse.com Centers for Disease Control and Prevention: TonerPromos.no Contact a health care provider if: You have side effects from your medicine such as: Repeated muscle twitches, coughing, or speech outbursts. Trouble sleeping. Loss of appetite. Dizziness. Racing heartbeat. Stomach pain. Headaches. You have new or worsening behavior problems. You are struggling with anxiety, depression, or substance abuse. Get help right away if: You have a severe reaction to a medicine. These symptoms may be an emergency. Get help right away. Call 911. Do not wait to see if the symptoms will go  away. Do not drive yourself to the hospital. Take one of these steps if you feel like you may hurt yourself or others, or have thoughts about taking your own life: Go to your nearest emergency room. Call 911. Call the National Suicide Prevention Lifeline at 434 235 5173 or 988. This is open 24 hours a day. Text the Crisis Text Line at (236) 031-9588. Summary With treatment and support, you can live with ADHD and do well in school and relationships. To strengthen your relationships with family members while treating your condition, consider taking part in family therapy. Talk to your teachers about ways they can help you. Do your homework in a quiet room. Keep all follow-up visits. Your health care provider will need to monitor your condition and adjust your treatment over time. This information is not intended to replace advice given to you by your health care provider. Make sure you discuss any questions you have with your health care provider. Document Revised: 08/29/2021 Document Reviewed: 08/29/2021 Elsevier Patient Education  2024 [employer].

## 2024-03-08 ENCOUNTER — Other Ambulatory Visit (HOSPITAL_COMMUNITY): Payer: Self-pay

## 2024-03-14 ENCOUNTER — Other Ambulatory Visit (HOSPITAL_COMMUNITY): Payer: Self-pay

## 2024-03-15 ENCOUNTER — Other Ambulatory Visit (HOSPITAL_COMMUNITY): Payer: Self-pay

## 2024-05-13 ENCOUNTER — Other Ambulatory Visit (HOSPITAL_COMMUNITY): Payer: Self-pay

## 2024-05-14 ENCOUNTER — Other Ambulatory Visit (HOSPITAL_COMMUNITY): Payer: Self-pay

## 2024-05-14 ENCOUNTER — Telehealth (HOSPITAL_COMMUNITY): Payer: Self-pay | Admitting: Pharmacy Technician

## 2024-05-15 ENCOUNTER — Other Ambulatory Visit (HOSPITAL_COMMUNITY): Payer: Self-pay

## 2024-05-29 ENCOUNTER — Ambulatory Visit: Payer: Self-pay | Admitting: Pediatrics

## 2024-05-29 VITALS — BP 124/68 | Ht 72.2 in | Wt 168.5 lb

## 2024-05-29 DIAGNOSIS — F900 Attention-deficit hyperactivity disorder, predominantly inattentive type: Secondary | ICD-10-CM

## 2024-06-01 ENCOUNTER — Other Ambulatory Visit (HOSPITAL_COMMUNITY): Payer: Self-pay

## 2024-06-01 MED ORDER — SERDEXMETHYLPHEN-DEXMETHYLPHEN 26.1-5.2 MG PO CAPS: 1.0000 | ORAL_CAPSULE | Freq: Every day | ORAL | 0 refills | Status: AC

## 2024-06-01 NOTE — Patient Instructions (Signed)
Living With Attention Deficit Hyperactivity Disorder, Teen  If you have been diagnosed with attention deficit hyperactivity disorder (ADHD), you may be relieved that you now know why you have felt or behaved a certain way. Still, you may feel overwhelmed about the treatment ahead. You may also wonder how to get the support you need and how to deal with the condition on a day-to-day basis. With treatment and support, you can live with ADHD and do well in school and relationships. How to manage lifestyle changes Managing lifestyle changes can be challenging. Seeking support from your healthcare provider, therapist, family, and friends can be helpful. Managing stress Stress is your body's reaction to life changes and events, both good and bad. Some steps you can take to cope with stress include: Learning more about ADHD. Exercising regularly. Even a short daily walk can lower stress levels. Getting enough sleep each night and eating a healthy diet. Taking time each day to practice stress-reduction techniques, such as deep breathing, meditation, or yoga. Spending time laughing, having fun with friends, and keeping a positive attitude. Do not use alcohol, tobacco, or illegal drugs to manage stress. Relationships To strengthen your relationships with family members while treating your condition, consider taking part in family therapy. You might also: Attend self-help groups alone or with a loved one. Write down the rules that everyone agrees to follow. Talk to your parents about things that are difficult for you. Talk to your parents about gaining more independence as you are ready.  Coping with ADHD in school You can take these steps to help manage your schoolwork: Complete homework in a quiet room, free from distractions. Check in regularly with your teachers about class notes, assignments, and tests. Use your school planner to keep track of due dates for assignments and homework. You can also  ask a counselor or therapist about ways that would help you to: Take notes. Keep your schoolwork and schedule organized. Study more efficiently. Manage your time. Talk to your teachers about your ADHD and ways that they can help you. Some things that teachers can do to help include: Reading about "tips for teachers" on managing ADHD by going to chadd.org or other trusted sources. Giving you the help and support that you may need, perhaps through a 504 plan or other educational accommodations. Having regular meetings with you to check in on how you are doing. Scheduling extra time for you to complete assignments and tests. Tutoring you after school if necessary. Talking to others Explain how ADHD affects you at school and at home. Talk about the symptoms that it causes. Share some basic facts about ADHD, such as: ADHD involves the brain and affects your behavior. You cannot just tell yourself to focus or sit still. You learn ways to make challenging things, such as staying organized, easier for you. You may choose or prefer activities or careers that do not require you to sit still or pay attention for long periods of time. Consider the following when talking to others: Try to keep your emotion out of important discussions, and speak in a calm, logical way. Listen closely and patiently to your loved ones. Try to understand their point of view, and try to avoid getting defensive. Take responsibility for your actions. Ask that others do not take your behaviors personally. Talk openly about what you need from your loved ones and how they can support you. How to recognize changes in your condition The following signs may mean that your treatment is  working well and your condition is improving: You are on time for school and other commitments. You are more organized. Others notice improvement in your behaviors. You are thinking more clearly. The following signs may mean that your treatment is  not working well: Problems with organization. Difficulty staying focused. Problems completing assignments at school. Not listening to instructions. Loved ones are frustrated with you. Forgetting things often. Follow these instructions at home: Medicines Take over-the-counter and prescription medicines only as told by your health care provider. Your health care provider may suggest certain medicines and therapy may also be prescribed. Check with your health care provider before taking any new medicines. General instructions Create structure and an organized atmosphere at home. For example: Make a list of tasks, then rank them from most important to least important. Work on one task at a time until your listed tasks are done. Make a daily schedule and follow it consistently every day. Use an appointment calendar, and check it 2-3 times a day to keep on track. Keep it with you when you leave the house. Create spaces where you keep certain things, and always put things back in their places after you use them. Keep all follow-up visits. Your health care provider will need to monitor your condition and adjust your treatment over time. Where to find more information Learn more about ADHD from: Children and Adults with Attention Deficit Hyperactivity Disorder: chadd.[employer] of Mental Health: BloggerCourse.com Centers for Disease Control and Prevention: TonerPromos.no Contact a health care provider if: You have side effects from your medicine such as: Repeated muscle twitches, coughing, or speech outbursts. Trouble sleeping. Loss of appetite. Dizziness. Racing heartbeat. Stomach pain. Headaches. You have new or worsening behavior problems. You are struggling with anxiety, depression, or substance abuse. Get help right away if: You have a severe reaction to a medicine. These symptoms may be an emergency. Get help right away. Call 911. Do not wait to see if the symptoms will go  away. Do not drive yourself to the hospital. Take one of these steps if you feel like you may hurt yourself or others, or have thoughts about taking your own life: Go to your nearest emergency room. Call 911. Call the National Suicide Prevention Lifeline at 434 235 5173 or 988. This is open 24 hours a day. Text the Crisis Text Line at (236) 031-9588. Summary With treatment and support, you can live with ADHD and do well in school and relationships. To strengthen your relationships with family members while treating your condition, consider taking part in family therapy. Talk to your teachers about ways they can help you. Do your homework in a quiet room. Keep all follow-up visits. Your health care provider will need to monitor your condition and adjust your treatment over time. This information is not intended to replace advice given to you by your health care provider. Make sure you discuss any questions you have with your health care provider. Document Revised: 08/29/2021 Document Reviewed: 08/29/2021 Elsevier Patient Education  2024 [employer].

## 2024-06-01 NOTE — Progress Notes (Signed)
" °  °  Thomas Vazquez is a 19 y.o. old male here for ADHD medication management  BP 124/68   Ht 6' 0.2 (1.834 m)   Wt 168 lb 8 oz (76.4 kg)   BMI 22.73 kg/m  Growth %ile SmartLinks can only be used for patients less than 28 years old.  --Normal growth parameters and Blood pressure.  --Parent reports child is doing well on present dose with no significant side effects reported.  --Plan to continue on current dose already has unused script to be available on 1/8. Send 2 post dated prescriptions.  Plan to return in 3 months for ADHD med check or prior for any issues or concerns.   Meds ordered this encounter  Medications   Serdexmethylphen-Dexmethylphen 26.1-5.2 MG CAPS    Sig: Take 1 capsule by mouth daily.    Dispense:  30 capsule    Refill:  0    Please do not fill till 07/01/24   Serdexmethylphen-Dexmethylphen 26.1-5.2 MG CAPS    Sig: Take 1 capsule by mouth daily.    Dispense:  30 capsule    Refill:  0    Please do not fill till 07/31/24    Return in about 3 months (around 08/27/2024).  Abran Hamilton Paytin Ramakrishnan D.O.  "

## 2024-07-31 ENCOUNTER — Encounter: Payer: Self-pay | Admitting: Pediatrics
